# Patient Record
Sex: Male | Born: 1970 | Race: Black or African American | Hispanic: No | Marital: Single | Smoking: Current every day smoker
Health system: Southern US, Community
[De-identification: ages and names within clinical notes are randomized; demographics above are authoritative.]

## PROBLEM LIST (undated history)

## (undated) HISTORY — PX: EYE SURGERY: SHX253

---

## 2018-01-25 ENCOUNTER — Other Ambulatory Visit: Payer: Self-pay

## 2018-01-25 ENCOUNTER — Emergency Department (HOSPITAL_BASED_OUTPATIENT_CLINIC_OR_DEPARTMENT_OTHER)
Admission: EM | Admit: 2018-01-25 | Discharge: 2018-01-25 | Disposition: A | Discharge status: Home / Self Care | Payer: Self-pay | Attending: Emergency Medicine | Admitting: Emergency Medicine

## 2018-01-25 ENCOUNTER — Encounter (HOSPITAL_BASED_OUTPATIENT_CLINIC_OR_DEPARTMENT_OTHER): Payer: Self-pay

## 2018-01-25 DIAGNOSIS — J020 Streptococcal pharyngitis: Secondary | ICD-10-CM | POA: Insufficient documentation

## 2018-01-25 MED ORDER — DEXAMETHASONE 6 MG PO TABS
10.0000 mg | ORAL_TABLET | Freq: Once | ORAL | Status: AC
  Administered 2018-01-25: 10 mg via ORAL
  Filled 2018-01-25: qty 1

## 2018-01-25 MED ORDER — PENICILLIN G BENZATHINE & PROC 1200000 UNIT/2ML IM SUSP
1.2000 10*6.[IU] | Freq: Once | INTRAMUSCULAR | Status: AC
  Administered 2018-01-25: 1.2 10*6.[IU] via INTRAMUSCULAR
  Filled 2018-01-25: qty 2

## 2018-01-25 NOTE — ED Triage Notes (Signed)
C/o sore throat x 2 weeks-NAD-steady gait

## 2018-01-25 NOTE — ED Notes (Addendum)
C/o sore throat x 2 weeks  Feels swollen   Denies fever or cough

## 2018-01-25 NOTE — ED Provider Notes (Signed)
[facility] EMERGENCY DEPARTMENT Provider Note   CSN: [id number] Arrival date & time: 01/25/18  1112     History   Chief Complaint Chief Complaint  Patient presents with  . Sore Throat    HPI Ethan Sweeney is a 48 y.o. male.  48 yo M with a chief complaints of sore throat.  Going on for the past couple weeks.  Patient had a family member that was diagnosed with strep throat and he thinks he contracted it from him.  Worse on the left than the right.  He thought he got worse overnight and has had painful swallowing.  He denies fevers or chills.  Denies cough.  The history is provided by the patient.  Sore Throat  This is a new problem. The current episode started more than 1 week ago. The problem occurs constantly. The problem has been gradually worsening. Pertinent negatives include no chest pain, no abdominal pain, no headaches and no shortness of breath. The symptoms are aggravated by swallowing. Nothing relieves the symptoms. He has tried nothing for the symptoms. The treatment provided no relief.    History reviewed. No pertinent past medical history.  There are no active problems to display for this patient.   Past Surgical History:  Procedure Laterality Date  . EYE SURGERY          Home Medications    Prior to Admission medications   Not on File    Family History No family history on file.  Social History Social History   Tobacco Use  . Smoking status: Never Smoker  . Smokeless tobacco: Never Used  Substance Use Topics  . Alcohol use: Yes    Comment: occ  . Drug use: Never     Allergies   Patient has no known allergies.   Review of Systems Review of Systems  Constitutional: Negative for chills and fever.  HENT: Positive for congestion and sore throat. Negative for facial swelling.   Eyes: Negative for discharge and visual disturbance.  Respiratory: Negative for shortness of breath.   Cardiovascular: Negative for chest pain and  palpitations.  Gastrointestinal: Negative for abdominal pain, diarrhea and vomiting.  Musculoskeletal: Negative for arthralgias and myalgias.  Skin: Negative for color change and rash.  Neurological: Negative for tremors, syncope and headaches.  Psychiatric/Behavioral: Negative for confusion and dysphoric mood.     Physical Exam Updated Vital Signs BP 133/89 (BP Location: Left Arm)   Pulse 88   Temp 98.8 F (37.1 C) (Oral)   Resp 20   Ht 6\' 1"  (1.854 m)   Wt (!) 139.3 kg   SpO2 100%   BMI 40.50 kg/m   Physical Exam Vitals signs and nursing note reviewed.  Constitutional:      Appearance: He is well-developed.  HENT:     Head: Normocephalic and atraumatic.     Comments: Left anterior cervical lymphadenopathy.  Left tonsillar swelling greater than right.  Purulent.  Swollen turbinates.  Tolerating secretions without difficulty.  Able to rotate his head 45 degrees in either direction. Eyes:     Pupils: Pupils are equal, round, and reactive to light.  Neck:     Musculoskeletal: Normal range of motion and neck supple.     Vascular: No JVD.  Cardiovascular:     Rate and Rhythm: Normal rate and regular rhythm.     Heart sounds: No murmur. No friction rub. No gallop.   Pulmonary:     Effort: No respiratory distress.  Breath sounds: No wheezing.  Abdominal:     General: There is no distension.     Tenderness: There is no guarding or rebound.  Musculoskeletal: Normal range of motion.  Skin:    Coloration: Skin is not pale.     Findings: No rash.  Neurological:     Mental Status: He is alert and oriented to person, place, and time.  Psychiatric:        Behavior: Behavior normal.      ED Treatments / Results  Labs (all labs ordered are listed, but only abnormal results are displayed) Labs Reviewed - No data to display  EKG None  Radiology No results found.  Procedures Procedures (including critical care time)  Medications Ordered in ED Medications    penicillin g procaine-penicillin g benzathine (BICILLIN-CR) injection 600000-600000 units (has no administration in time range)  dexamethasone (DECADRON) tablet 10 mg (has no administration in time range)     Initial Impression / Assessment and Plan / ED Course  I have reviewed the triage vital signs and the nursing notes.  Pertinent labs & imaging results that were available during my care of the patient were reviewed by me and considered in my medical decision making (see chart for details).     49 yo M with a chief complaint of sore throat.  Worsening over the past 2 weeks.  Has been exposed to strep.  Clinically looks like strep, no cough, unilateral swelling.  Left anterior cervical lymphadenopathy.  Will treat presumptively for strep.  Will give a dose of Bicillin.  Decadron.  Discharge home.  11:54 AM:  I have discussed the diagnosis/risks/treatment options with the patient and believe the pt to be eligible for discharge home to follow-up with PCP. We also discussed returning to the ED immediately if new or worsening sx occur. We discussed the sx which are most concerning (e.g., sudden worsening pain, fever, inability to tolerate by mouth) that necessitate immediate return. Medications administered to the patient during their visit and any new prescriptions provided to the patient are listed below.  Medications given during this visit Medications  penicillin g procaine-penicillin g benzathine (BICILLIN-CR) injection 600000-600000 units (has no administration in time range)  dexamethasone (DECADRON) tablet 10 mg (has no administration in time range)     The patient appears reasonably screen and/or stabilized for discharge and I doubt any other medical condition or other [facility] requiring further screening, evaluation, or treatment in the ED at this time prior to discharge.    Final Clinical Impressions(s) / ED Diagnoses   Final diagnoses:  Strep pharyngitis    ED Discharge Orders     None       Melene Plan, DO 01/25/18 1154

## 2018-01-25 NOTE — Discharge Instructions (Signed)
Take tylenol 2 pills 4 times a day and motrin 4 pills 3 times a day.  Drink plenty of fluids.  Return for worsening shortness of breath, headache, confusion. Follow up with your family doctor.   

## 2018-04-28 ENCOUNTER — Telehealth (HOSPITAL_BASED_OUTPATIENT_CLINIC_OR_DEPARTMENT_OTHER): Payer: Self-pay | Admitting: Emergency Medicine

## 2018-04-28 ENCOUNTER — Encounter (HOSPITAL_BASED_OUTPATIENT_CLINIC_OR_DEPARTMENT_OTHER): Payer: Self-pay | Admitting: Emergency Medicine

## 2018-04-28 ENCOUNTER — Other Ambulatory Visit: Payer: Self-pay

## 2018-04-28 ENCOUNTER — Emergency Department (HOSPITAL_BASED_OUTPATIENT_CLINIC_OR_DEPARTMENT_OTHER)
Admission: EM | Admit: 2018-04-28 | Discharge: 2018-04-28 | Disposition: A | Discharge status: Home / Self Care | Payer: Self-pay | Attending: Emergency Medicine | Admitting: Emergency Medicine

## 2018-04-28 DIAGNOSIS — J029 Acute pharyngitis, unspecified: Secondary | ICD-10-CM

## 2018-04-28 LAB — GROUP A STREP BY PCR: Group A Strep by PCR: DETECTED — AB

## 2018-04-28 MED ORDER — AMOXICILLIN 875 MG PO TABS: 875.0000 mg | ORAL_TABLET | Freq: Two times a day (BID) | ORAL | 0 refills | Status: AC

## 2018-04-28 MED ORDER — IBUPROFEN 800 MG PO TABS: 800.0000 mg | ORAL_TABLET | Freq: Three times a day (TID) | ORAL | 0 refills | Status: AC

## 2018-04-28 MED ORDER — ACETAMINOPHEN 500 MG PO TABS
1000.0000 mg | ORAL_TABLET | Freq: Once | ORAL | Status: AC
  Administered 2018-04-28: 07:00:00 1000 mg via ORAL
  Filled 2018-04-28: qty 2

## 2018-04-28 MED ORDER — IBUPROFEN 800 MG PO TABS
800.0000 mg | ORAL_TABLET | Freq: Once | ORAL | Status: AC
  Administered 2018-04-28: 800 mg via ORAL
  Filled 2018-04-28: qty 1

## 2018-04-28 MED ORDER — LIDOCAINE VISCOUS HCL 2 % MT SOLN
15.0000 mL | Freq: Once | OROMUCOSAL | Status: AC
  Administered 2018-04-28: 15 mL via OROMUCOSAL
  Filled 2018-04-28: qty 15

## 2018-04-28 NOTE — ED Triage Notes (Signed)
Pt c/o Sore throat and fever for few days.

## 2018-04-28 NOTE — ED Provider Notes (Signed)
[facility] EMERGENCY DEPARTMENT Provider Note   CSN: [id number] Arrival date & time: 04/28/18  [id number]    History   Chief Complaint No chief complaint on file.   HPI Ethan Sweeney is a 48 y.o. male.     The history is provided by the patient.  Sore Throat  This is a new problem. The current episode started more than 1 week ago. The problem occurs constantly. The problem has not changed since onset.Pertinent negatives include no chest pain, no abdominal pain, no headaches and no shortness of breath. Nothing aggravates the symptoms. Nothing relieves the symptoms. Treatments tried: Dayquil last dose was yesterday  The treatment provided no relief.  No travel, no sick contacts,  No anosmia, no loss of taste.  No cough no shortness of breath. No body aches.  Has not been exposed to anyone who has been ill.   No change in voice.  No difficulty swallowing.    No past medical history on file.  There are no active problems to display for this patient.   Past Surgical History:  Procedure Laterality Date  . EYE SURGERY          Home Medications    Prior to Admission medications   Not on File    Family History No family history on file.  Social History Social History   Tobacco Use  . Smoking status: Never Smoker  . Smokeless tobacco: Never Used  Substance Use Topics  . Alcohol use: Yes    Comment: occ  . Drug use: Never     Allergies   Patient has no known allergies.   Review of Systems Review of Systems  Constitutional: Negative for appetite change and chills.  HENT: Positive for sore throat. Negative for congestion, drooling, ear pain, facial swelling, trouble swallowing and voice change.   Respiratory: Negative for cough and shortness of breath.   Cardiovascular: Negative for chest pain.  Gastrointestinal: Negative for abdominal pain and diarrhea.  Musculoskeletal: Negative for myalgias, neck pain and neck stiffness.  Neurological: Negative for  headaches.  All other systems reviewed and are negative.    Physical Exam Updated Vital Signs There were no vitals taken for this visit.  Physical Exam Vitals signs and nursing note reviewed.  Constitutional:      General: He is not in acute distress.    Appearance: He is normal weight.  HENT:     Head: Normocephalic and atraumatic.     Nose: Nose normal.  Eyes:     Conjunctiva/sclera: Conjunctivae normal.     Pupils: Pupils are equal, round, and reactive to light.  Neck:     Musculoskeletal: Normal range of motion and neck supple.  Cardiovascular:     Rate and Rhythm: Normal rate and regular rhythm.     Pulses: Normal pulses.     Heart sounds: Normal heart sounds.  Pulmonary:     Effort: Pulmonary effort is normal.     Breath sounds: Normal breath sounds. No stridor. No rhonchi.  Abdominal:     General: Abdomen is flat. Bowel sounds are normal.     Tenderness: There is no abdominal tenderness. There is no guarding or rebound.  Musculoskeletal: Normal range of motion.  Lymphadenopathy:     Cervical: No cervical adenopathy.  Skin:    General: Skin is warm and dry.     Capillary Refill: Capillary refill takes less than 2 seconds.  Neurological:     General: No focal deficit present.  Mental Status: He is alert and oriented to person, place, and time.  Psychiatric:        Mood and Affect: Mood normal.        Behavior: Behavior normal.      ED Treatments / Results  Labs (all labs ordered are listed, but only abnormal results are displayed) Labs Reviewed  GROUP A STREP BY PCR    EKG None  Radiology No results found.  Procedures Procedures (including critical care time)  Medications Ordered in ED Medications - No data to display   Strep is currently a send out, patient will be called with positive result.  These are the same symptoms he had when he was last seen.  Will treat with amoxicillin.  Will not give steroids given this is imprudent with active  COVID in the area.  Have advised 14 days of home self quarantine and follow up should be accomplished using an electronic visit.  Intact phonation, no pain with displacement of the trachea.  Patient is very well appearing and being the patient is doing well and no SOB, will no send testing.  We will institute quarantine.  I have discussed this at length with the patient and he agrees to maintain quarantine and not go out to the store or see other people.  This information is also provided on the patient's discharge paperwork.     Final Clinical Impressions(s) / ED Diagnoses     Return for intractable cough, coughing up blood,fevers >100.4 unrelieved by medication, shortness of breath, intractable vomiting, chest pain, shortness of breath, weakness,numbness, changes in speech, facial asymmetry,abdominal pain, passing out,Inability to tolerate liquids or food, cough, altered mental status or any concerns. No signs of systemic illness or infection. The patient is nontoxic-appearing on exam and vital signs are within normal limits.   I have reviewed the triage vital signs and the nursing notes. Pertinent labs &imaging results that were available during my care of the patient were reviewed by me and considered in my medical decision making (see chart for details).  After history, exam, and medical workup I feel the patient has been appropriately medically screened and is safe for discharge home. Pertinent diagnoses were discussed with the patient. Patient was given return precautions.    Kayson Bullis, MD 04/28/18 [phone]

## 2018-04-28 NOTE — Discharge Instructions (Signed)
Person Under Monitoring Name: Ethan Sweeney  Location: [address] [place] [address]   Infection Prevention Recommendations for Individuals Confirmed to have, or Being Evaluated for, 2019 Novel Coronavirus (COVID-19) Infection Who Receive Care at Home  Individuals who are confirmed to have, or are being evaluated for, COVID-19 should follow the prevention steps below until a healthcare provider or local or state health department says they can return to normal activities.  Stay home except to get medical care You should restrict activities outside your home, except for getting medical care. Do not go to work, school, or public areas, and do not use public transportation or taxis.  Call ahead before visiting your doctor Before your medical appointment, call the healthcare provider and tell them that you have, or are being evaluated for, COVID-19 infection. This will help the healthcare providers office take steps to keep other people from getting infected. Ask your healthcare provider to call the local or state health department.  Monitor your symptoms Seek prompt medical attention if your illness is worsening (e.g., difficulty breathing). Before going to your medical appointment, call the healthcare provider and tell them that you have, or are being evaluated for, COVID-19 infection. Ask your healthcare provider to call the local or state health department.  Wear a facemask You should wear a facemask that covers your nose and mouth when you are in the same room with other people and when you visit a healthcare provider. People who live with or visit you should also wear a facemask while they are in the same room with you.  Separate yourself from other people in your home As much as possible, you should stay in a different room from other people in your home. Also, you should use a separate bathroom, if available.  Avoid sharing household items You should not  share dishes, drinking glasses, cups, eating utensils, towels, bedding, or other items with other people in your home. After using these items, you should wash them thoroughly with soap and water.  Cover your coughs and sneezes Cover your mouth and nose with a tissue when you cough or sneeze, or you can cough or sneeze into your sleeve. Throw used tissues in a lined trash can, and immediately wash your hands with soap and water for at least 20 seconds or use an alcohol-based hand rub.  Wash your [employer] your hands often and thoroughly with soap and water for at least 20 seconds. You can use an alcohol-based hand sanitizer if soap and water are not available and if your hands are not visibly dirty. Avoid touching your eyes, nose, and mouth with unwashed hands.   Prevention Steps for Caregivers and Household Members of Individuals Confirmed to have, or Being Evaluated for, COVID-19 Infection Being Cared for in the Home  If you live with, or provide care at home for, a person confirmed to have, or being evaluated for, COVID-19 infection please follow these guidelines to prevent infection:  Follow healthcare providers instructions Make sure that you understand and can help the patient follow any healthcare provider instructions for all care.  Provide for the patients basic needs You should help the patient with basic needs in the home and provide support for getting groceries, prescriptions, and other personal needs.  Monitor the patients symptoms If they are getting sicker, call his or her medical provider and tell them that the patient has, or is being evaluated for, COVID-19 infection. This will help the healthcare providers  office take steps to keep other people from getting infected. Ask the healthcare provider to call the local or state health department.  Limit the number of people who have contact with the patient If possible, have only one caregiver for the  patient. Other household members should stay in another home or place of residence. If this is not possible, they should stay in another room, or be separated from the patient as much as possible. Use a separate bathroom, if available. Restrict visitors who do not have an essential need to be in the home.  Keep older adults, very young children, and other sick people away from the patient Keep older adults, very young children, and those who have compromised immune systems or chronic health conditions away from the patient. This includes people with chronic heart, lung, or kidney conditions, diabetes, and cancer.  Ensure good ventilation Make sure that shared spaces in the home have good air flow, such as from an air conditioner or an opened window, weather permitting.  Wash your hands often Wash your hands often and thoroughly with soap and water for at least 20 seconds. You can use an alcohol based hand sanitizer if soap and water are not available and if your hands are not visibly dirty. Avoid touching your eyes, nose, and mouth with unwashed hands. Use disposable paper towels to dry your hands. If not available, use dedicated cloth towels and replace them when they become wet.  Wear a facemask and gloves Wear a disposable facemask at all times in the room and gloves when you touch or have contact with the patients blood, body fluids, and/or secretions or excretions, such as sweat, saliva, sputum, nasal mucus, vomit, urine, or feces.  Ensure the mask fits over your nose and mouth tightly, and do not touch it during use. Throw out disposable facemasks and gloves after using them. Do not reuse. Wash your hands immediately after removing your facemask and gloves. If your personal clothing becomes contaminated, carefully remove clothing and launder. Wash your hands after handling contaminated clothing. Place all used disposable facemasks, gloves, and other waste in a lined container before  disposing them with other household waste. Remove gloves and wash your hands immediately after handling these items.  Do not share dishes, glasses, or other household items with the patient Avoid sharing household items. You should not share dishes, drinking glasses, cups, eating utensils, towels, bedding, or other items with a patient who is confirmed to have, or being evaluated for, COVID-19 infection. After the person uses these items, you should wash them thoroughly with soap and water.  Wash laundry thoroughly Immediately remove and wash clothes or bedding that have blood, body fluids, and/or secretions or excretions, such as sweat, saliva, sputum, nasal mucus, vomit, urine, or feces, on them. Wear gloves when handling laundry from the patient. Read and follow directions on labels of laundry or clothing items and detergent. In general, wash and dry with the warmest temperatures recommended on the label.  Clean all areas the individual has used often Clean all touchable surfaces, such as counters, tabletops, doorknobs, bathroom fixtures, toilets, phones, keyboards, tablets, and bedside tables, every day. Also, clean any surfaces that may have blood, body fluids, and/or secretions or excretions on them. Wear gloves when cleaning surfaces the patient has come in contact with. Use a diluted bleach solution (e.g., dilute bleach with 1 part bleach and 10 parts water) or a household disinfectant with a label that says EPA-registered for coronaviruses. To make a  bleach solution at home, add 1 tablespoon of bleach to 1 quart (4 cups) of water. For a larger supply, add  cup of bleach to 1 gallon (16 cups) of water. Read labels of cleaning products and follow recommendations provided on product labels. Labels contain instructions for safe and effective use of the cleaning product including precautions you should take when applying the product, such as wearing gloves or eye protection and making sure you  have good ventilation during use of the product. Remove gloves and wash hands immediately after cleaning.  Monitor yourself for signs and symptoms of illness Caregivers and household members are considered close contacts, should monitor their health, and will be asked to limit movement outside of the home to the extent possible. Follow the monitoring steps for close contacts listed on the symptom monitoring form.   ? If you have additional questions, contact your local health department or call the epidemiologist on call at [phone] (available 24/7). ? This guidance is subject to change. For the most up-to-date guidance from [facility], please refer to their website: [email or web address]

## 2019-01-27 ENCOUNTER — Encounter (HOSPITAL_BASED_OUTPATIENT_CLINIC_OR_DEPARTMENT_OTHER): Payer: Self-pay | Admitting: Emergency Medicine

## 2019-01-27 ENCOUNTER — Emergency Department (HOSPITAL_BASED_OUTPATIENT_CLINIC_OR_DEPARTMENT_OTHER)
Admission: EM | Admit: 2019-01-27 | Discharge: 2019-01-27 | Disposition: A | Discharge status: Home / Self Care | Payer: HRSA Program | Attending: Emergency Medicine | Admitting: Emergency Medicine

## 2019-01-27 ENCOUNTER — Other Ambulatory Visit: Payer: Self-pay

## 2019-01-27 DIAGNOSIS — F17290 Nicotine dependence, other tobacco product, uncomplicated: Secondary | ICD-10-CM | POA: Insufficient documentation

## 2019-01-27 DIAGNOSIS — E669 Obesity, unspecified: Secondary | ICD-10-CM | POA: Insufficient documentation

## 2019-01-27 DIAGNOSIS — Z6841 Body Mass Index (BMI) 40.0 and over, adult: Secondary | ICD-10-CM | POA: Insufficient documentation

## 2019-01-27 DIAGNOSIS — R431 Parosmia: Secondary | ICD-10-CM | POA: Diagnosis not present

## 2019-01-27 DIAGNOSIS — M7918 Myalgia, other site: Secondary | ICD-10-CM | POA: Insufficient documentation

## 2019-01-27 DIAGNOSIS — Z20822 Contact with and (suspected) exposure to covid-19: Secondary | ICD-10-CM

## 2019-01-27 DIAGNOSIS — R430 Anosmia: Secondary | ICD-10-CM | POA: Insufficient documentation

## 2019-01-27 DIAGNOSIS — Z79899 Other long term (current) drug therapy: Secondary | ICD-10-CM | POA: Diagnosis not present

## 2019-01-27 LAB — SARS CORONAVIRUS 2 (TAT 6-24 HRS): SARS Coronavirus 2: NEGATIVE

## 2019-01-27 NOTE — ED Triage Notes (Signed)
Pt's roommate tested positive for COVID and states he has loss of taste and smell and chills with myalgia for 1 week. Would like to be tested.

## 2019-01-27 NOTE — ED Provider Notes (Signed)
[facility] EMERGENCY DEPARTMENT Provider Note   CSN: [id number] Arrival date & time: 01/27/19  2458     History Chief Complaint  Patient presents with  . Loss of taste and smell  . Chills  . Generalized Body Aches    Ethan Sweeney is a 49 y.o. male.  HPI   49 year old male presenting over concern for possible Covid.  He reports that his roommate tested positive in the past week.  He has had a loss of taste and smell for about a week as well.  Some body aches and chills.  No cough or dyspnea.  No vomiting or diarrhea.  History reviewed. No pertinent past medical history.  There are no problems to display for this patient.   Past Surgical History:  Procedure Laterality Date  . EYE SURGERY         History reviewed. No pertinent family history.  Social History   Tobacco Use  . Smoking status: Current Every Day Smoker    Types: Cigars  . Smokeless tobacco: Never Used  Substance Use Topics  . Alcohol use: Yes    Comment: occ  . Drug use: Never    Home Medications Prior to Admission medications   Medication Sig Start Date End Date Taking? Authorizing Provider  amoxicillin (AMOXIL) 875 MG tablet Take 1 tablet (875 mg total) by mouth 2 (two) times daily. 04/28/18   Palumbo, April, MD  ibuprofen (ADVIL) 800 MG tablet Take 1 tablet (800 mg total) by mouth 3 (three) times daily. 04/28/18   Palumbo, April, MD    Allergies    Patient has no known allergies.  Review of Systems   Review of Systems All systems reviewed and negative, other than as noted in HPI.  Physical Exam Updated Vital Signs BP (!) 148/90 (BP Location: Right Arm)   Pulse 76   Temp 98.6 F (37 C) (Oral)   Resp 18   Ht 6\' 1"  (1.854 m)   Wt (!) 142.9 kg   SpO2 99%   BMI 41.56 kg/m   Physical Exam Vitals and nursing note reviewed.  Constitutional:      General: He is not in acute distress.    Appearance: He is well-developed. He is obese.  HENT:     Head: Normocephalic and  atraumatic.  Eyes:     General:        Right eye: No discharge.        Left eye: No discharge.     Conjunctiva/sclera: Conjunctivae normal.  Cardiovascular:     Rate and Rhythm: Normal rate and regular rhythm.     Heart sounds: Normal heart sounds. No murmur. No friction rub. No gallop.   Pulmonary:     Effort: Pulmonary effort is normal. No respiratory distress.     Breath sounds: Normal breath sounds.  Abdominal:     General: There is no distension.     Palpations: Abdomen is soft.     Tenderness: There is no abdominal tenderness.  Musculoskeletal:        General: No tenderness.     Cervical back: Neck supple.  Skin:    General: Skin is warm and dry.  Neurological:     Mental Status: He is alert.  Psychiatric:        Behavior: Behavior normal.        Thought Content: Thought content normal.     ED Results / Procedures / Treatments   Labs (all labs ordered are listed, but only  abnormal results are displayed) Labs Reviewed  SARS CORONAVIRUS 2 (TAT 6-24 HRS)    EKG None  Radiology No results found.  Procedures Procedures (including critical care time)  Medications Ordered in ED Medications - No data to display  ED Course  I have reviewed the triage vital signs and the nursing notes.  Pertinent labs & imaging results that were available during my care of the patient were reviewed by me and considered in my medical decision making (see chart for details).    MDM Rules/Calculators/A&P                     49 year old male with likely Covid.  He reports known exposure.  Typical symptoms.  He generally appears well though.  No acute respiratory complaints.  Oxygen saturations are normal on room air.  He has no increased work of breathing.  Tested.  Continued quarantine return precautions reiterated.  Ethan Sweeney was evaluated in Emergency Department on 01/27/2019 for the symptoms described in the history of present illness. He was evaluated in the context of the  global COVID-19 pandemic, which necessitated consideration that the patient might be at risk for infection with the SARS-CoV-2 virus that causes COVID-19. Institutional protocols and algorithms that pertain to the evaluation of patients at risk for COVID-19 are in a state of rapid change based on information released by regulatory bodies including the CDC and federal and state organizations. These policies and algorithms were followed during the patient's care in the ED.  Final Clinical Impression(s) / ED Diagnoses Final diagnoses:  Close exposure to COVID-19 virus    Rx / DC Orders ED Discharge Orders    None       Virgel Manifold, MD 01/27/19 [phone]

## 2019-04-20 ENCOUNTER — Encounter (HOSPITAL_BASED_OUTPATIENT_CLINIC_OR_DEPARTMENT_OTHER): Payer: Self-pay | Admitting: Emergency Medicine

## 2019-04-20 ENCOUNTER — Emergency Department (HOSPITAL_BASED_OUTPATIENT_CLINIC_OR_DEPARTMENT_OTHER): Payer: BC Managed Care – PPO

## 2019-04-20 ENCOUNTER — Emergency Department (HOSPITAL_BASED_OUTPATIENT_CLINIC_OR_DEPARTMENT_OTHER)
Admission: EM | Admit: 2019-04-20 | Discharge: 2019-04-20 | Disposition: A | Discharge status: Home / Self Care | Payer: BC Managed Care – PPO | Attending: Emergency Medicine | Admitting: Emergency Medicine

## 2019-04-20 ENCOUNTER — Other Ambulatory Visit: Payer: Self-pay

## 2019-04-20 DIAGNOSIS — S8392XA Sprain of unspecified site of left knee, initial encounter: Secondary | ICD-10-CM | POA: Diagnosis not present

## 2019-04-20 DIAGNOSIS — Y9389 Activity, other specified: Secondary | ICD-10-CM | POA: Diagnosis not present

## 2019-04-20 DIAGNOSIS — Y998 Other external cause status: Secondary | ICD-10-CM | POA: Diagnosis not present

## 2019-04-20 DIAGNOSIS — X509XXA Other and unspecified overexertion or strenuous movements or postures, initial encounter: Secondary | ICD-10-CM | POA: Diagnosis not present

## 2019-04-20 DIAGNOSIS — S8992XA Unspecified injury of left lower leg, initial encounter: Secondary | ICD-10-CM | POA: Diagnosis present

## 2019-04-20 DIAGNOSIS — F17290 Nicotine dependence, other tobacco product, uncomplicated: Secondary | ICD-10-CM | POA: Diagnosis not present

## 2019-04-20 DIAGNOSIS — Y92012 Bathroom of single-family (private) house as the place of occurrence of the external cause: Secondary | ICD-10-CM | POA: Diagnosis not present

## 2019-04-20 NOTE — ED Provider Notes (Signed)
[facility] EMERGENCY DEPARTMENT Provider Note   CSN: [id number] Arrival date & time: 04/20/19  1121     History Chief Complaint  Patient presents with  . Knee Pain    Ethan Sweeney is a 49 y.o. male.  HPI  Patient is a overweight 50 year old male with no pertinent past medical history presented today with left knee pain that began yesterday when he got out of the bathtub and heard a pop in his knee improvement of weakness and pain.  He states that it is been worse with flexion of his knee since that time he states no pain with extension of his knee.  He states he is able to walk and denies any back pain, numbness of his lower extremity, cold extremity paresthesias were abrasion or laceration.    Patient denies any fall trauma, head injury, loss of consciousness.  Denies any weakness in lower extremities.  Denies any fevers or any swelling in his joints. No tick exposure.     History reviewed. No pertinent past medical history.  There are no problems to display for this patient.   Past Surgical History:  Procedure Laterality Date  . EYE SURGERY         No family history on file.  Social History   Tobacco Use  . Smoking status: Current Every Day Smoker    Types: Cigars  . Smokeless tobacco: Never Used  Substance Use Topics  . Alcohol use: Yes    Comment: occ  . Drug use: Never    Home Medications Prior to Admission medications   Medication Sig Start Date End Date Taking? Authorizing Provider  amoxicillin (AMOXIL) 875 MG tablet Take 1 tablet (875 mg total) by mouth 2 (two) times daily. 04/28/18   Palumbo, April, MD  ibuprofen (ADVIL) 800 MG tablet Take 1 tablet (800 mg total) by mouth 3 (three) times daily. 04/28/18   Palumbo, April, MD    Allergies    Patient has no known allergies.  Review of Systems   Review of Systems  Constitutional: Negative for chills and fever.  HENT: Negative for congestion.   Respiratory: Negative for shortness of breath.    Cardiovascular: Negative for chest pain.  Gastrointestinal: Negative for abdominal pain.  Musculoskeletal: Negative for neck pain.       Pause knee pain    Physical Exam Updated Vital Signs BP (!) 137/93 (BP Location: Left Arm)   Pulse 83   Temp 99.7 F (37.6 C) (Oral)   Resp 18   Ht 6\' 1"  (1.854 m)   Wt (!) 145.2 kg   SpO2 100%   BMI 42.22 kg/m   Physical Exam Vitals and nursing note reviewed.  Constitutional:      General: He is not in acute distress.    Appearance: Normal appearance. He is obese. He is not ill-appearing.  HENT:     Head: Normocephalic and atraumatic.     Mouth/Throat:     Mouth: Mucous membranes are moist.  Eyes:     General: No scleral icterus.       Right eye: No discharge.        Left eye: No discharge.     Conjunctiva/sclera: Conjunctivae normal.  Cardiovascular:     Rate and Rhythm: Normal rate.     Comments: Bilateral DP/PT pulses intact and symmetric. Pulmonary:     Effort: Pulmonary effort is normal.     Breath sounds: No stridor.  Musculoskeletal:     Comments: Left knee with  tenderness to palpation of the patella.  No posterior, medial or lateral tenderness to palpation.  Full passive range of motion of knee.  Patient will actively extend but slow to flex left knee 2/2 pain  No redness or is notable swelling.  Strength 5/5 knee flexion extension.  Skin:    General: Skin is warm and dry.     Capillary Refill: Capillary refill takes less than 2 seconds.     Comments: No redness or erythema bruising or abrasions or lacerations  Neurological:     Mental Status: He is alert and oriented to person, place, and time. Mental status is at baseline.     Comments: Intact bilateral lower extremities sensation and ankle reflexes.  Ambulatory without gait abnormality.  Psychiatric:        Mood and Affect: Mood normal.        Behavior: Behavior normal.     ED Results / Procedures / Treatments   Labs (all labs ordered are listed, but only  abnormal results are displayed) Labs Reviewed - No data to display  EKG None  Radiology DG Knee AP/LAT W/Sunrise Left  Result Date: 04/20/2019 CLINICAL DATA:  Twisted knee EXAM: LEFT KNEE 3 VIEWS COMPARISON:  None. FINDINGS: Alignment is anatomic. There is no acute fracture. Joint effusion is present. No intrinsic osseous lesion. IMPRESSION: No acute fracture.  Joint effusion. Electronically Signed   By: Guadlupe Spanish M.D.   On: 04/20/2019 12:34    Procedures Procedures (including critical care time)  Medications Ordered in ED Medications - No data to display  ED Course  I have reviewed the triage vital signs and the nursing notes.  Pertinent labs & imaging results that were available during my care of the patient were reviewed by me and considered in my medical decision making (see chart for details).    MDM Rules/Calculators/A&P                      Patient is a overweight 49 year old male with no pertinent past medical history presented today with left knee pain that began yesterday when he got out of the bathtub and heard a pop in his knee improvement of weakness and pain.  He states that it is been worse with flexion of his knee since that time he states no pain with extension of his knee.  He states he is able to walk and denies any back pain, numbness of his lower extremity, cold extremity paresthesias were abrasion or laceration.    Physical exam is very reassuring.  He has some pain with flexion but otherwise has normal range of motion, strength, sensation, good pulses.  He states that he came in today because while he was at work his knee started hurting more and was concerned about dislocation.  Patient has no history of knee dislocations.    X-ray of patient's left knee with sunrise view obtained.  I independently reviewed this x-ray and there is no evidence of dislocation of the patella.  There is scant joint effusion however there is not evidence of my exam and  likely traumatic.  There is no evidence of fracture.  Discharge patient with recommendations for Tylenol and ibuprofen, orthopedic referral in case is required, and a knee sleeve.  Final Clinical Impression(s) / ED Diagnoses Final diagnoses:  Sprain of left knee, unspecified ligament, initial encounter    Rx / DC Orders ED Discharge Orders    None       Solon Augusta  S, PA 04/20/19 1241    Melene Plan, DO 04/20/19 1400

## 2019-04-20 NOTE — ED Triage Notes (Signed)
L knee pain since yesterday, states he twisted it.

## 2019-04-20 NOTE — Discharge Instructions (Signed)
Please use Tylenol or ibuprofen for pain.  You may use 600 mg ibuprofen every 6 hours or 1000 mg of Tylenol every 6 hours.  You may choose to alternate between the 2.  This would be most effective.  Not to exceed 4 g of Tylenol within 24 hours.  Not to exceed 3200 mg ibuprofen 24 hours.  Your examination today is most concerning for a knee sprain 1. Medications: alternate ibuprofen and tylenol for pain control, take all usual home medications as they are prescribed 2. Treatment: rest, ice, elevate and use an ACE wrap or other compressive therapy to decrease swelling. Also drink plenty of fluids and do plenty of gentle stretching and move the affected muscle through its normal range of motion to prevent stiffness. 3. Follow Up: If your symptoms do not improve please follow up with orthopedics/sports medicine or your PCP for discussion of your diagnoses and further evaluation after today's visit; if you do not have a primary care doctor use the resource guide provided to find one; Please return to the ER for worsening symptoms or other concerns.

## 2020-10-29 IMAGING — DX DG KNEE AP/LAT W/ SUNRISE*L*
3 series · 3 of 3 positions shown · non-contrast
Comparison: None.

CLINICAL DATA: Twisted knee

EXAM:
LEFT KNEE 3 VIEWS

[knee ap]
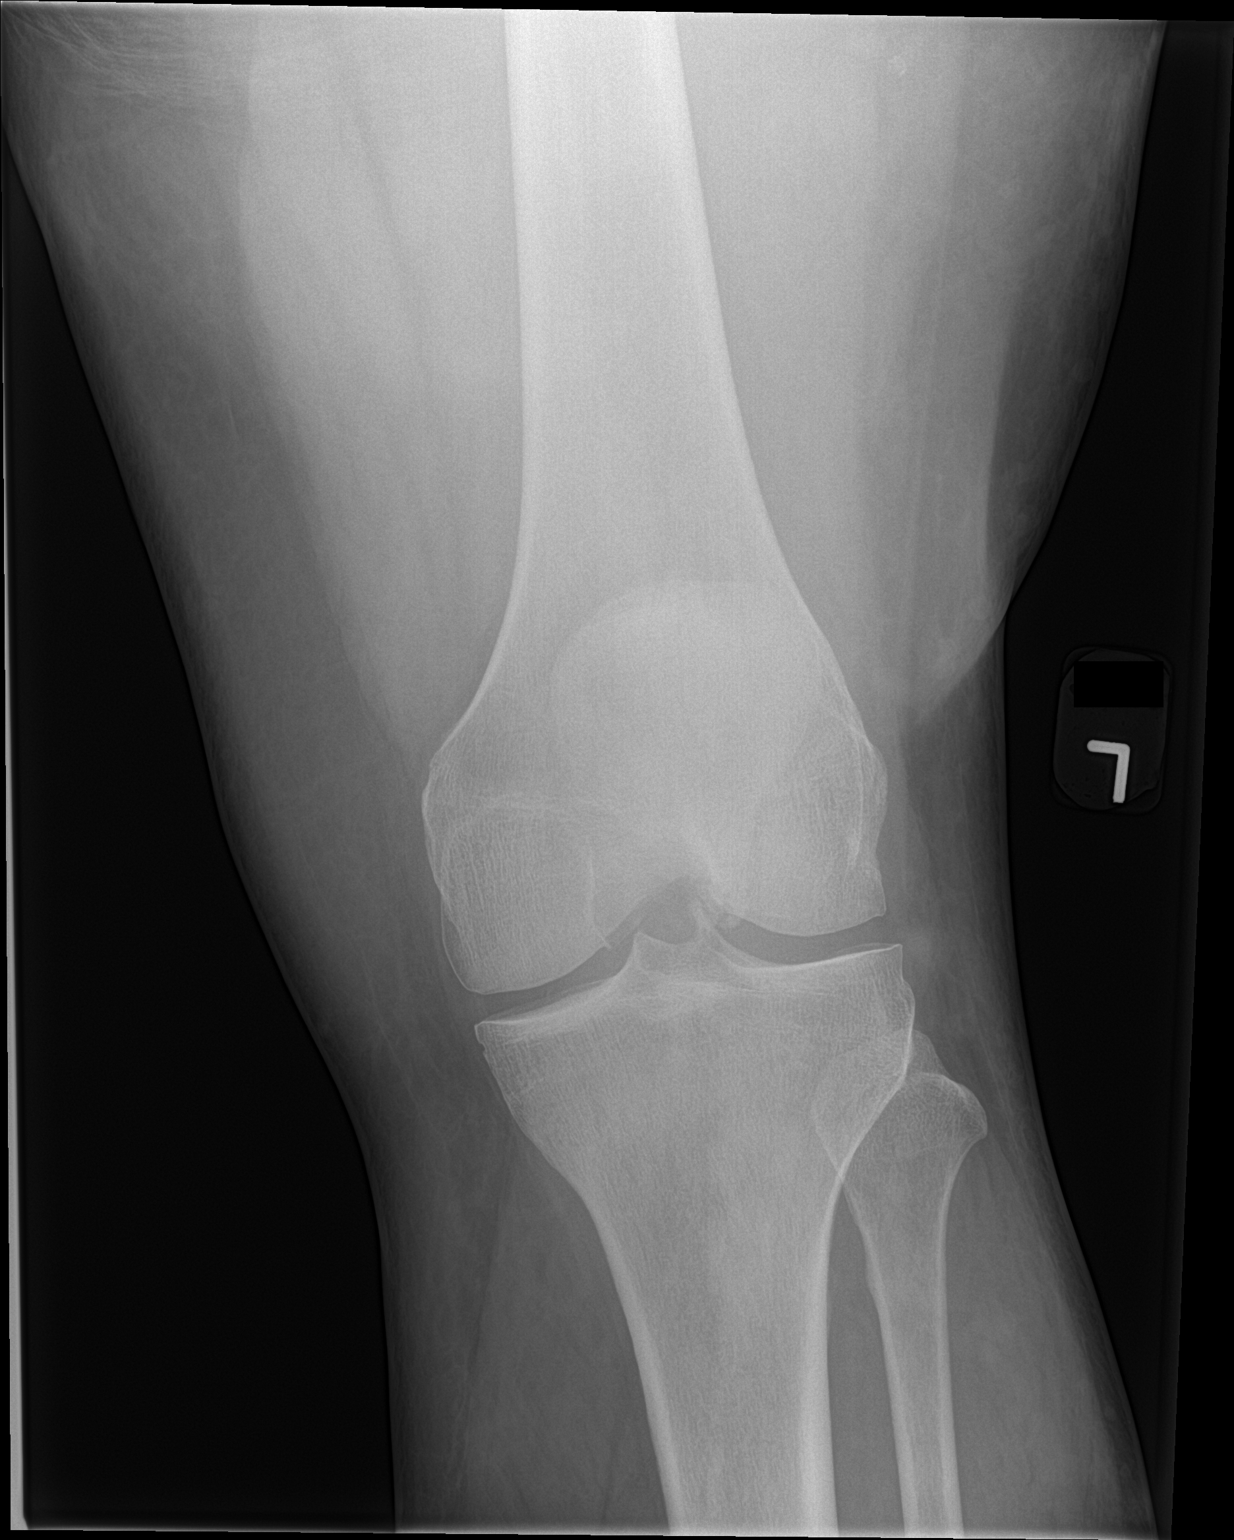

[knee lat]
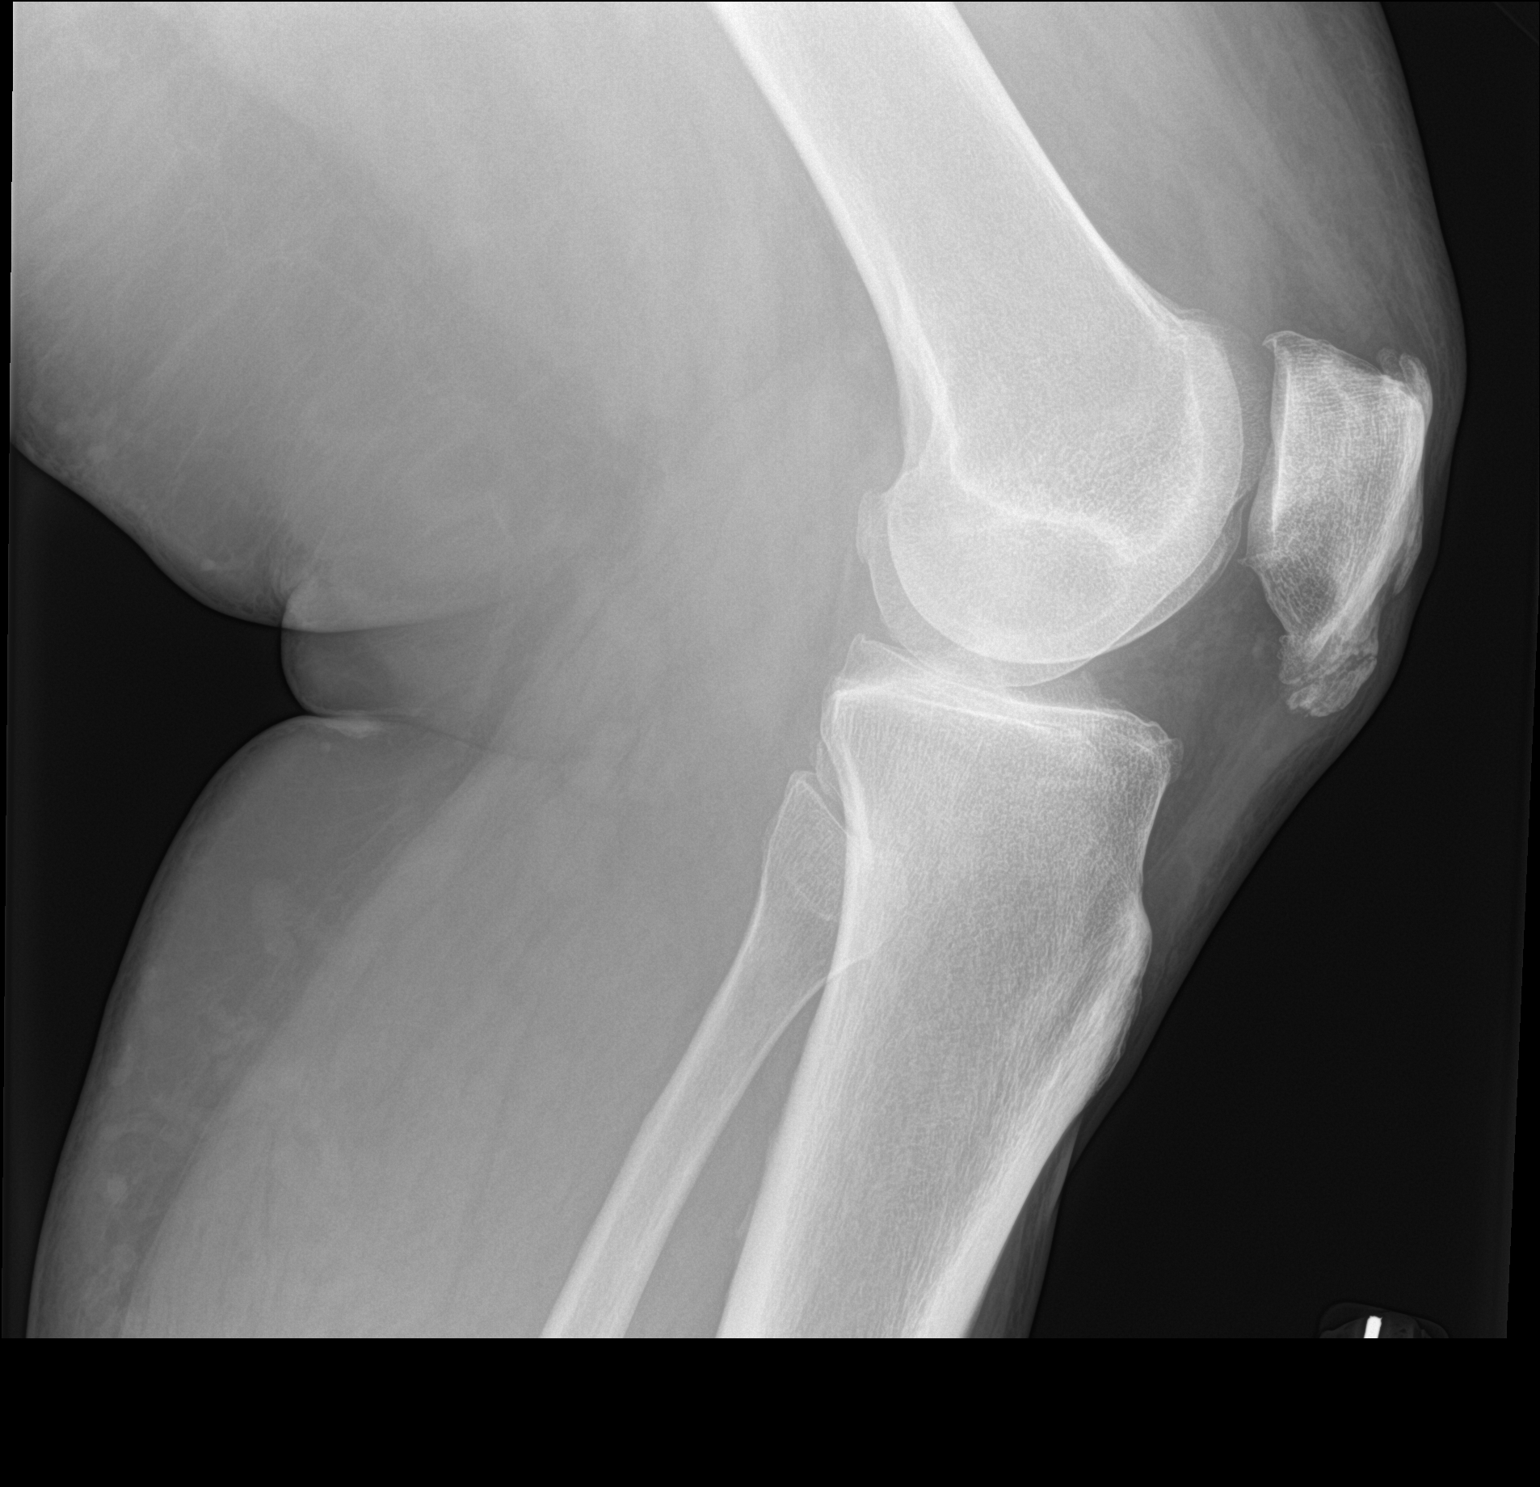

[patella skyline]
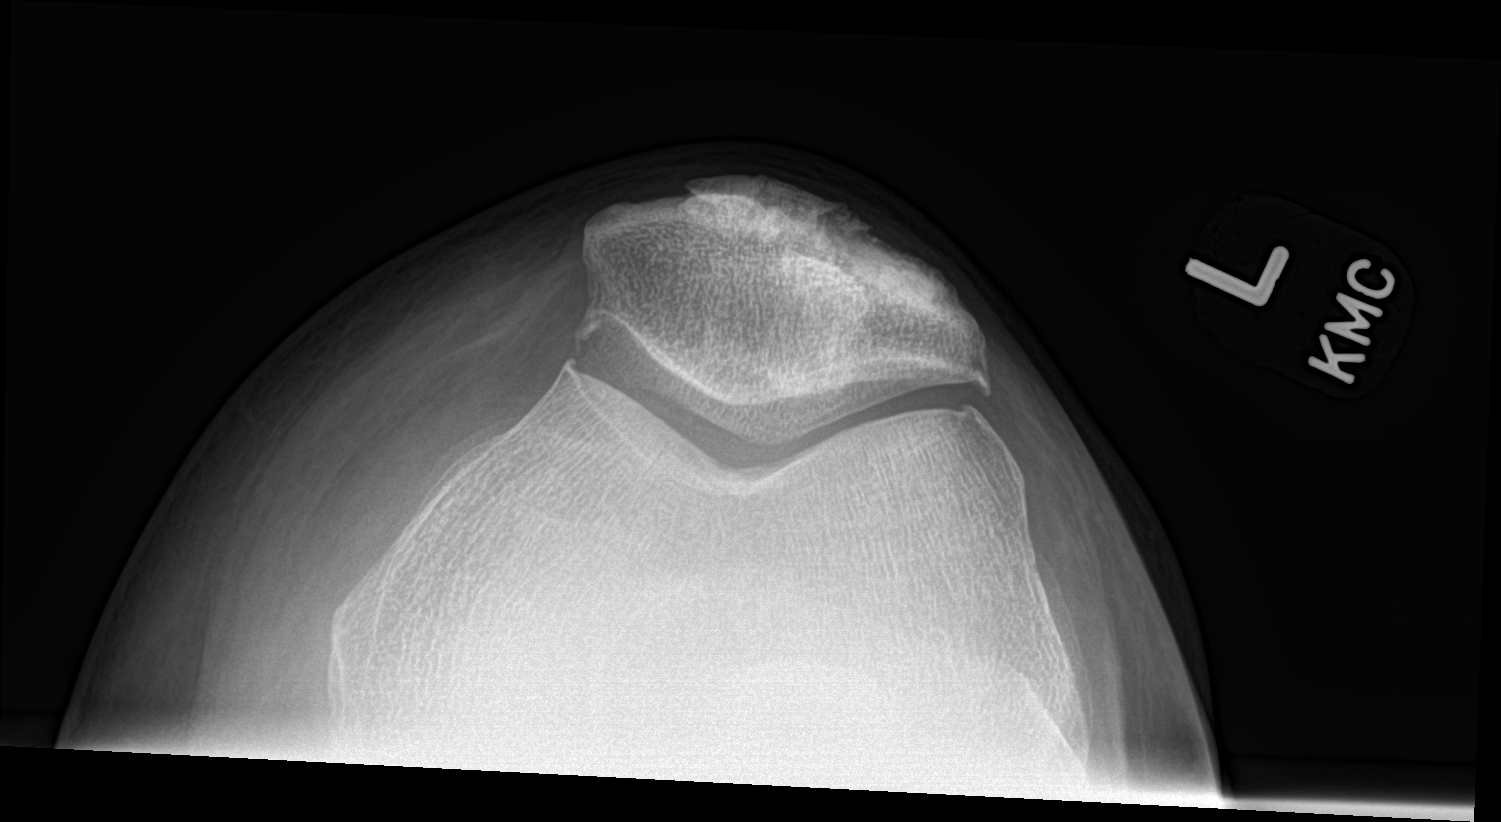

[3 of 3 positions shown; findings below may reference images not displayed]

FINDINGS: Alignment is anatomic. There is no acute fracture. Joint effusion is
present. No intrinsic osseous lesion.
IMPRESSION: No acute fracture.  Joint effusion.
